# Patient Record
Sex: Male | Born: 2000 | Race: White | Hispanic: No | Marital: Single | Smoking: Never smoker
Health system: Southern US, Community
[De-identification: ages and names within clinical notes are randomized; demographics above are authoritative.]

## PROBLEM LIST (undated history)

## (undated) DIAGNOSIS — F329 Major depressive disorder, single episode, unspecified: Secondary | ICD-10-CM

## (undated) DIAGNOSIS — T7840XA Allergy, unspecified, initial encounter: Secondary | ICD-10-CM

## (undated) DIAGNOSIS — F32A Depression, unspecified: Secondary | ICD-10-CM

## (undated) HISTORY — PX: NO PAST SURGERIES: SHX2092

## (undated) HISTORY — DX: Allergy, unspecified, initial encounter: T78.40XA

## (undated) HISTORY — DX: Depression, unspecified: F32.A

## (undated) HISTORY — DX: Major depressive disorder, single episode, unspecified: F32.9

---
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
## 2022-01-27 ENCOUNTER — Encounter: Payer: Self-pay | Admitting: Family Medicine
